# Patient Record
Sex: Male | Born: 1975 | Race: White | Hispanic: No | Marital: Married
Health system: Southern US, Community
[De-identification: ages and names within clinical notes are randomized; demographics above are authoritative.]

---

## 2017-01-15 DIAGNOSIS — J9601 Acute respiratory failure with hypoxia: Secondary | ICD-10-CM

## 2017-01-15 DIAGNOSIS — R569 Unspecified convulsions: Secondary | ICD-10-CM

## 2017-01-15 DIAGNOSIS — G9341 Metabolic encephalopathy: Secondary | ICD-10-CM

## 2017-01-15 DIAGNOSIS — F1910 Other psychoactive substance abuse, uncomplicated: Secondary | ICD-10-CM

## 2020-03-18 ENCOUNTER — Emergency Department (HOSPITAL_COMMUNITY)
Admission: EM | Admit: 2020-03-18 | Discharge: 2020-03-19 | Disposition: A | Discharge status: Home / Self Care | Payer: Medicaid Other | Attending: Emergency Medicine | Admitting: Emergency Medicine

## 2020-03-18 DIAGNOSIS — K920 Hematemesis: Secondary | ICD-10-CM | POA: Insufficient documentation

## 2020-03-18 DIAGNOSIS — Z5321 Procedure and treatment not carried out due to patient leaving prior to being seen by health care provider: Secondary | ICD-10-CM | POA: Insufficient documentation

## 2020-03-18 DIAGNOSIS — R509 Fever, unspecified: Secondary | ICD-10-CM | POA: Insufficient documentation

## 2020-03-18 DIAGNOSIS — R519 Headache, unspecified: Secondary | ICD-10-CM | POA: Insufficient documentation

## 2020-03-18 DIAGNOSIS — M549 Dorsalgia, unspecified: Secondary | ICD-10-CM | POA: Insufficient documentation

## 2020-03-18 DIAGNOSIS — R079 Chest pain, unspecified: Secondary | ICD-10-CM | POA: Insufficient documentation

## 2020-03-19 ENCOUNTER — Emergency Department (HOSPITAL_COMMUNITY): Payer: Medicaid Other

## 2020-03-19 ENCOUNTER — Other Ambulatory Visit: Payer: Self-pay

## 2020-03-19 LAB — CBC WITH DIFFERENTIAL/PLATELET
Abs Immature Granulocytes: 0.03 10*3/uL (ref 0.00–0.07)
Basophils Absolute: 0 10*3/uL (ref 0.0–0.1)
Basophils Relative: 0 %
Eosinophils Absolute: 0 10*3/uL (ref 0.0–0.5)
Eosinophils Relative: 0 %
HCT: 37.1 % — ABNORMAL LOW (ref 39.0–52.0)
Hemoglobin: 12.9 g/dL — ABNORMAL LOW (ref 13.0–17.0)
Immature Granulocytes: 0 %
Lymphocytes Relative: 10 %
Lymphs Abs: 0.9 10*3/uL (ref 0.7–4.0)
MCH: 28.5 pg (ref 26.0–34.0)
MCHC: 34.8 g/dL (ref 30.0–36.0)
MCV: 81.9 fL (ref 80.0–100.0)
Monocytes Absolute: 0.4 10*3/uL (ref 0.1–1.0)
Monocytes Relative: 4 %
Neutro Abs: 7.8 10*3/uL — ABNORMAL HIGH (ref 1.7–7.7)
Neutrophils Relative %: 86 %
Platelets: 122 10*3/uL — ABNORMAL LOW (ref 150–400)
RBC: 4.53 MIL/uL (ref 4.22–5.81)
RDW: 14.1 % (ref 11.5–15.5)
WBC: 9.2 10*3/uL (ref 4.0–10.5)
nRBC: 0 % (ref 0.0–0.2)

## 2020-03-19 LAB — URINALYSIS, ROUTINE W REFLEX MICROSCOPIC
Bilirubin Urine: NEGATIVE
Glucose, UA: NEGATIVE mg/dL
Hgb urine dipstick: NEGATIVE
Ketones, ur: NEGATIVE mg/dL
Leukocytes,Ua: NEGATIVE
Nitrite: NEGATIVE
Protein, ur: NEGATIVE mg/dL
Specific Gravity, Urine: 1.01 (ref 1.005–1.030)
pH: 7 (ref 5.0–8.0)

## 2020-03-19 LAB — COMPREHENSIVE METABOLIC PANEL
ALT: 108 U/L — ABNORMAL HIGH (ref 0–44)
AST: 85 U/L — ABNORMAL HIGH (ref 15–41)
Albumin: 3.5 g/dL (ref 3.5–5.0)
Alkaline Phosphatase: 243 U/L — ABNORMAL HIGH (ref 38–126)
Anion gap: 9 (ref 5–15)
BUN: 17 mg/dL (ref 6–20)
CO2: 27 mmol/L (ref 22–32)
Calcium: 10.8 mg/dL — ABNORMAL HIGH (ref 8.9–10.3)
Chloride: 101 mmol/L (ref 98–111)
Creatinine, Ser: 1.19 mg/dL (ref 0.61–1.24)
GFR, Estimated: >60 mL/min (ref >60)
Glucose, Bld: 124 mg/dL — ABNORMAL HIGH (ref 70–99)
Potassium: 4.4 mmol/L (ref 3.5–5.1)
Sodium: 137 mmol/L (ref 135–145)
Total Bilirubin: 1.1 mg/dL (ref 0.3–1.2)
Total Protein: 7.2 g/dL (ref 6.5–8.1)

## 2020-03-19 LAB — TROPONIN I (HIGH SENSITIVITY): Troponin I (High Sensitivity): 138 ng/L (ref <18)

## 2020-03-19 LAB — LACTIC ACID, PLASMA: Lactic Acid, Venous: 2.6 mmol/L (ref 0.5–1.9)

## 2020-03-19 NOTE — ED Triage Notes (Signed)
Reports N/V/D since 6pm.  Reports blood in stool today.  Family reports emesis looks like coffee grounds.  Reports headache X1 day.  Fall yesterday, has pain right side thorax area, good breath sounds.  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    IV in Right forearm, 300cc NS  Temp 101.8 CBG 114 120HR 133/86 94% RA  Hx of seizures, unmediated.    Pt informed RN that his family told him he was not responding to them.  His he has a pain on the right side of his back that goes to the right chest.

## 2020-03-19 NOTE — ED Notes (Signed)
Made aware of Lactic Acid result.  Patient was 2nd on list to come back to room.

## 2020-03-19 NOTE — ED Notes (Signed)
Pt left after being told it would be in his best interest to stay. This NT as well as another tried to talk him into staying and he simply was not listening to anything we had to say. This NT removed this IV in his right arm before he left.

## 2020-04-19 DEATH — deceased

## 2022-06-08 IMAGING — CR DG CHEST 1V PORT
1 series · 1 of 1 positions shown · non-contrast
Comparison: 01/15/2017

CLINICAL DATA: Chest pain

EXAM:
PORTABLE CHEST 1 VIEW

[AP]
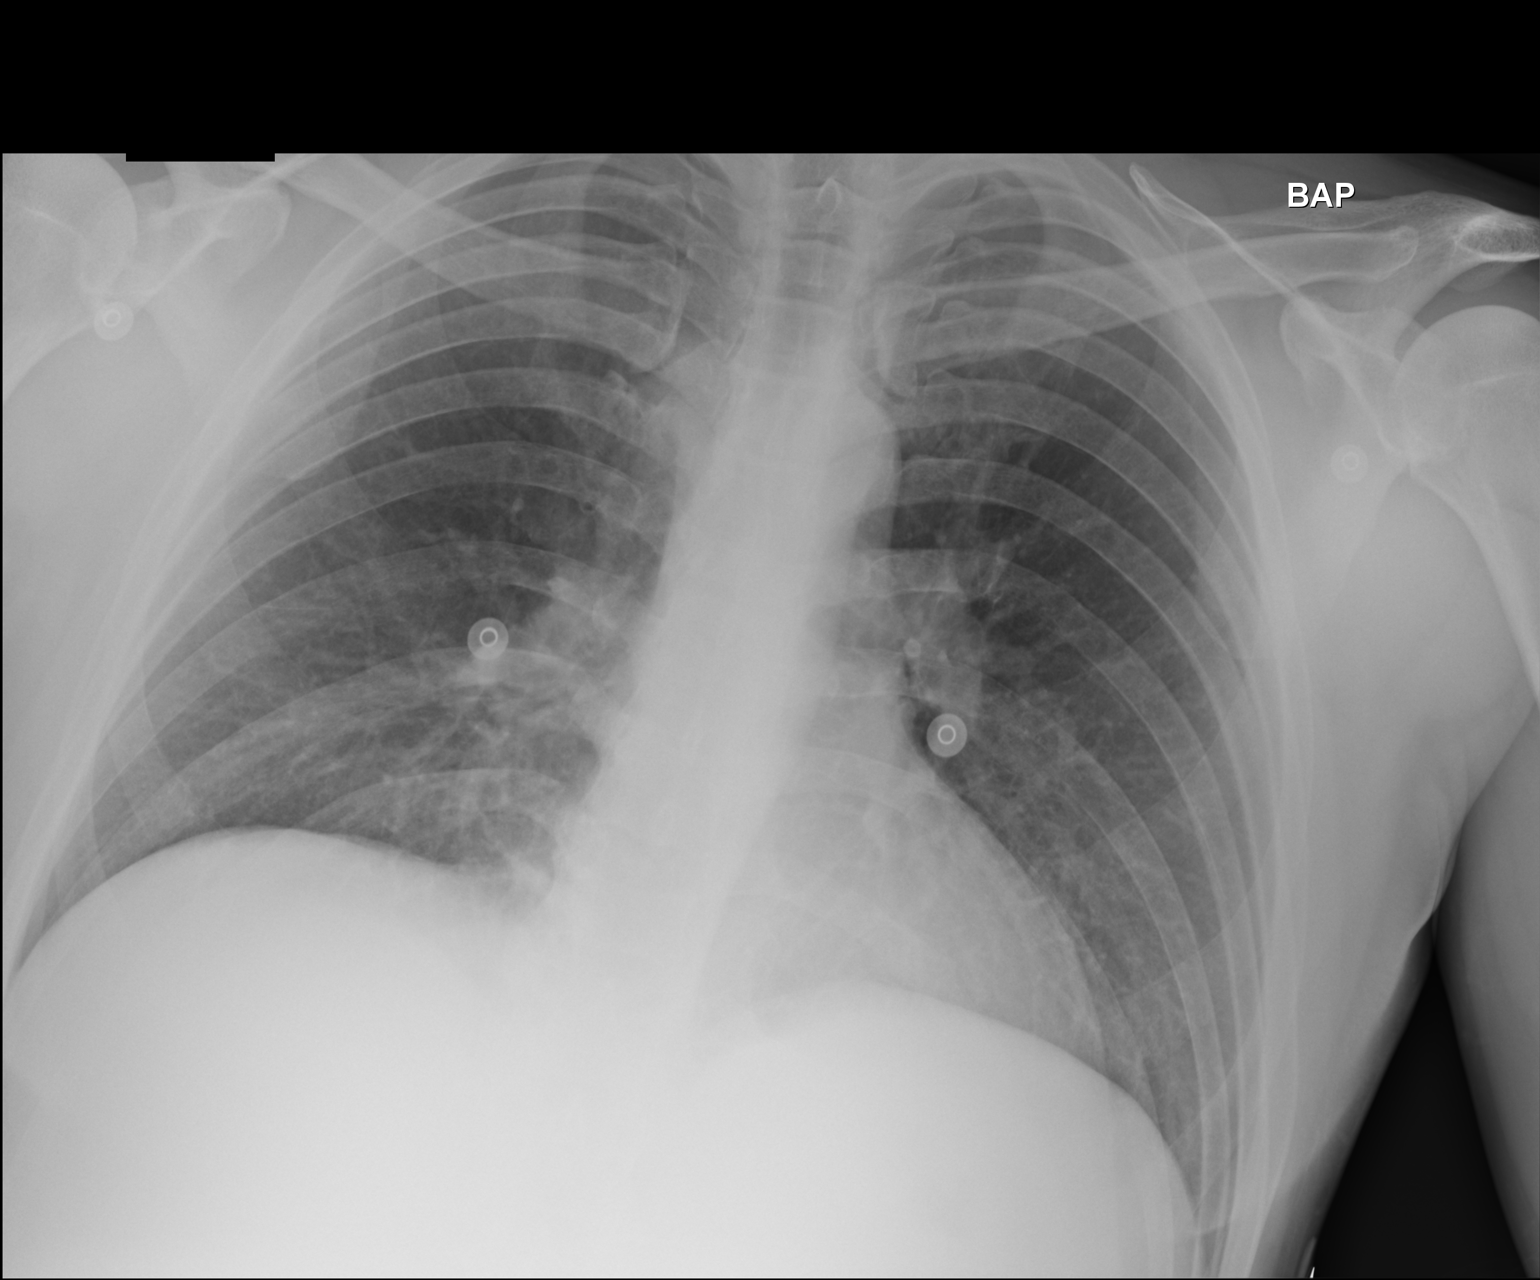

[1 of 1 positions shown; findings below may reference images not displayed]

FINDINGS: The heart size and mediastinal contours are within normal limits.
Both lungs are clear. The visualized skeletal structures are
unremarkable.
IMPRESSION: No active disease.
